# Patient Record
Sex: Male | Born: 1950 | Race: Black or African American | Hispanic: No | Smoking: Never smoker
Health system: Southern US, Community
[De-identification: ages and names within clinical notes are randomized; demographics above are authoritative.]

## PROBLEM LIST (undated history)

## (undated) DIAGNOSIS — E119 Type 2 diabetes mellitus without complications: Secondary | ICD-10-CM

## (undated) DIAGNOSIS — I10 Essential (primary) hypertension: Secondary | ICD-10-CM

## (undated) HISTORY — PX: TONSILLECTOMY: SUR1361

---

## 2005-10-22 ENCOUNTER — Emergency Department: Payer: Self-pay | Admitting: Emergency Medicine

## 2006-08-14 ENCOUNTER — Emergency Department: Payer: Self-pay | Admitting: Emergency Medicine

## 2013-06-06 ENCOUNTER — Emergency Department: Payer: Self-pay | Admitting: Emergency Medicine

## 2013-06-06 LAB — COMPREHENSIVE METABOLIC PANEL
Albumin: 4.2 g/dL (ref 3.4–5.0)
Alkaline Phosphatase: 94 U/L (ref 50–136)
Anion Gap: 3 — ABNORMAL LOW (ref 7–16)
BUN: 17 mg/dL (ref 7–18)
Bilirubin,Total: 0.6 mg/dL (ref 0.2–1.0)
Calcium, Total: 8.8 mg/dL (ref 8.5–10.1)
Chloride: 106 mmol/L (ref 98–107)
Co2: 29 mmol/L (ref 21–32)
EGFR (African American): >60
EGFR (Non-African Amer.): >60
Osmolality: 277 (ref 275–301)
Potassium: 4 mmol/L (ref 3.5–5.1)
SGOT(AST): 43 U/L — ABNORMAL HIGH (ref 15–37)
SGPT (ALT): 44 U/L (ref 12–78)
Total Protein: 7.8 g/dL (ref 6.4–8.2)

## 2013-06-06 LAB — URINALYSIS, COMPLETE
Bacteria: NONE SEEN
Blood: NEGATIVE
Leukocyte Esterase: NEGATIVE
Nitrite: NEGATIVE
Ph: 7 (ref 4.5–8.0)
RBC,UR: 2 /HPF (ref 0–5)
Specific Gravity: 1.026 (ref 1.003–1.030)
WBC UR: <1 /HPF (ref 0–5)

## 2013-06-06 LAB — CBC
HCT: 42.1 % (ref 40.0–52.0)
MCH: 29.1 pg (ref 26.0–34.0)
MCV: 88 fL (ref 80–100)
Platelet: 264 10*3/uL (ref 150–440)
RDW: 12.6 % (ref 11.5–14.5)
WBC: 7 10*3/uL (ref 3.8–10.6)

## 2013-06-06 LAB — LIPASE, BLOOD: Lipase: 85 U/L (ref 73–393)

## 2014-11-01 ENCOUNTER — Emergency Department: Payer: Self-pay | Admitting: Emergency Medicine

## 2015-06-17 IMAGING — CT CT ABD-PELV W/ CM
1 of 2 series · 15 of 32 positions shown, 19 images · non-contrast
Comparison: none

REASON FOR EXAM: (1) rlq + ruq ttp, eval appy vs chole; (2) rlq + ruq
ttp, eval appy vs chole
COMMENTS:   May transport without cardiac monitor

PROCEDURE:     CT  - CT ABDOMEN / PELVIS  W  - June 06, 2013  [DATE]
RESULT:     History: Pain.
Comparison study: No prior.

[Series 2: 3mm soft tissue · axial · 0.75mm/px · z∈[-1010,-596]mm · 15 of 150 slices shown, 19 images]
[im 6/150  soft-tissue]
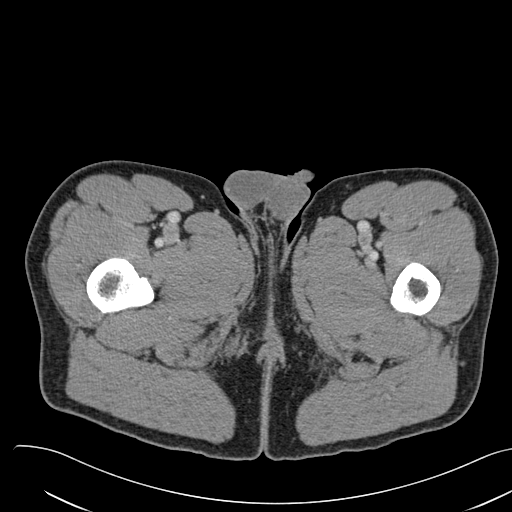
[im 6/150  bone]
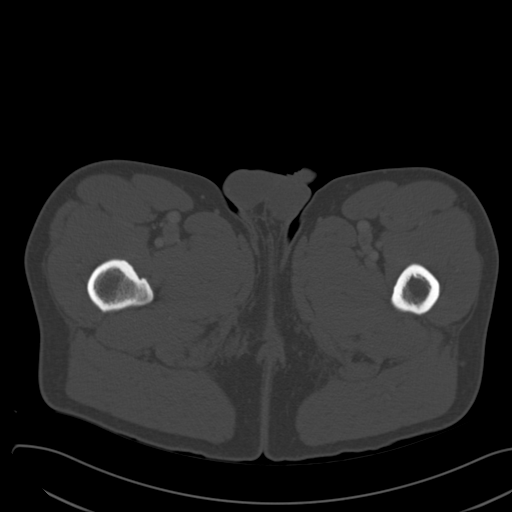
[im 18/150  soft-tissue]
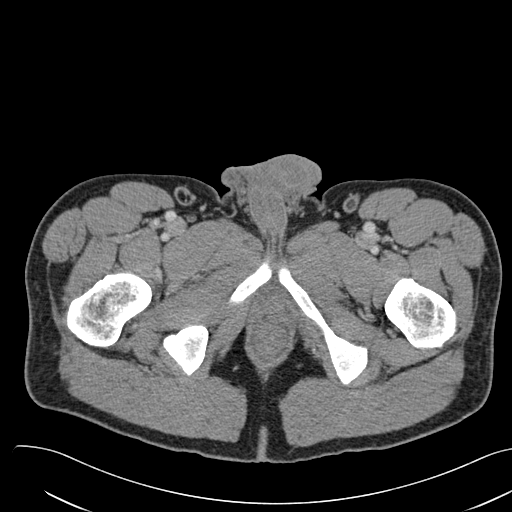
[im 30/150  soft-tissue]
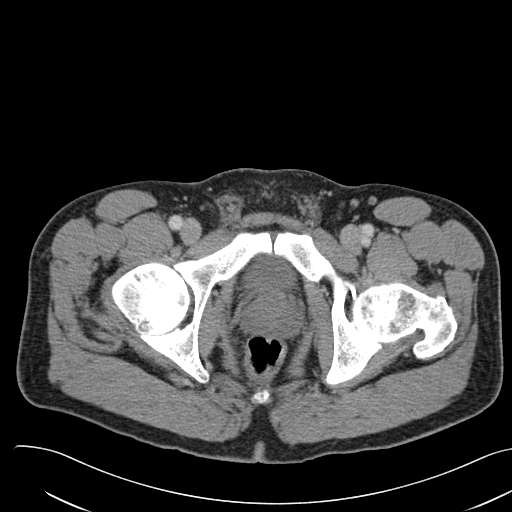
[im 42/150  soft-tissue]
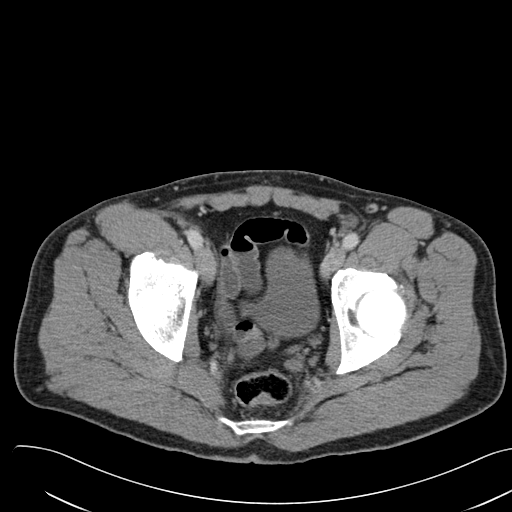
[im 54/150  soft-tissue]
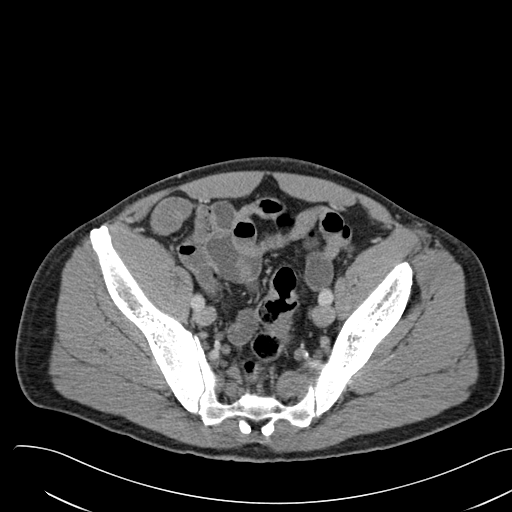
[im 66/150  soft-tissue]
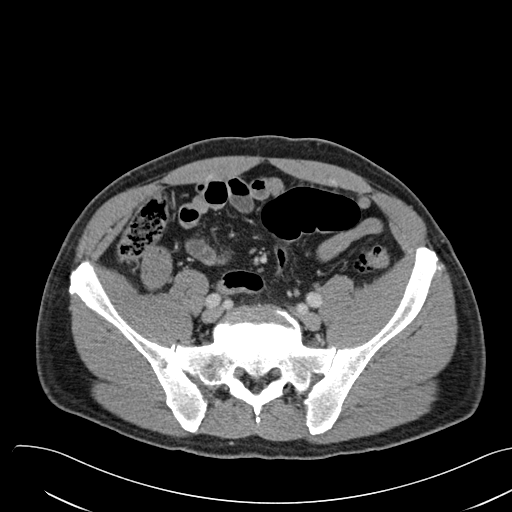
[im 78/150  soft-tissue]
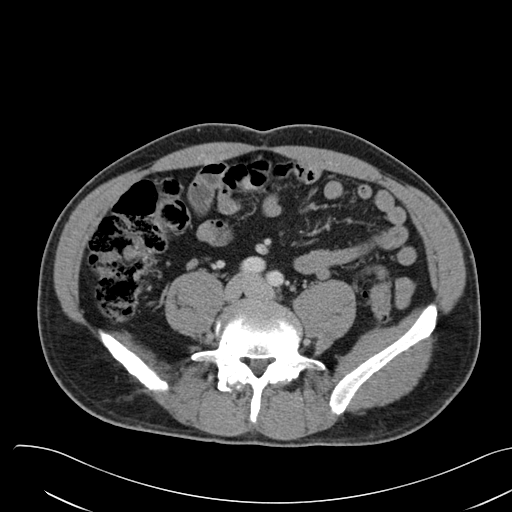
[im 84/150  soft-tissue]
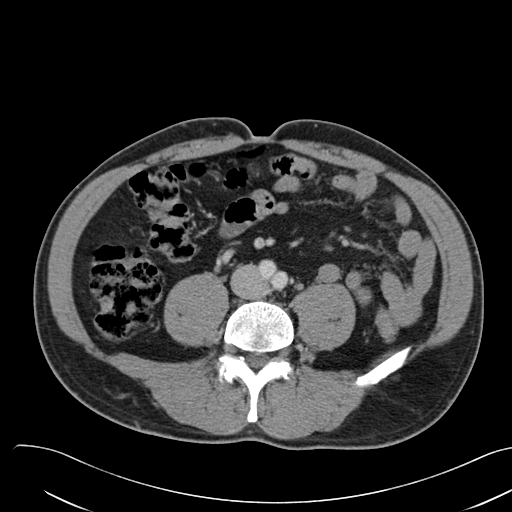
[im 96/150  soft-tissue]
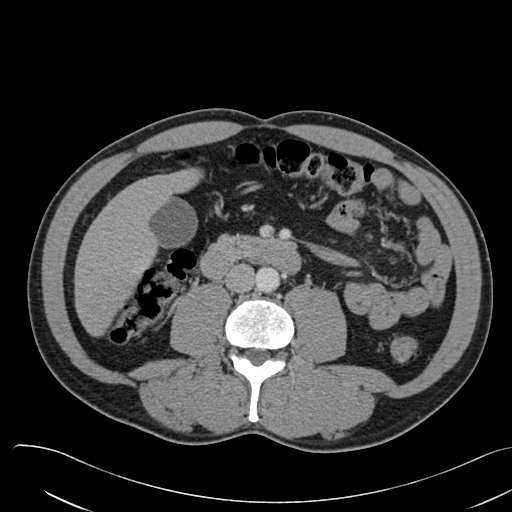
[im 96/150  bone]
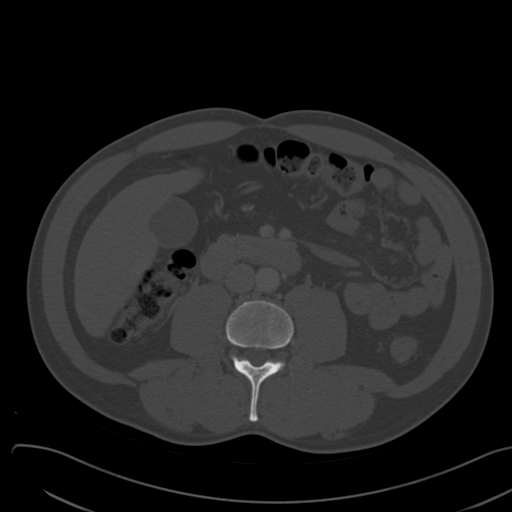
[im 108/150  soft-tissue]
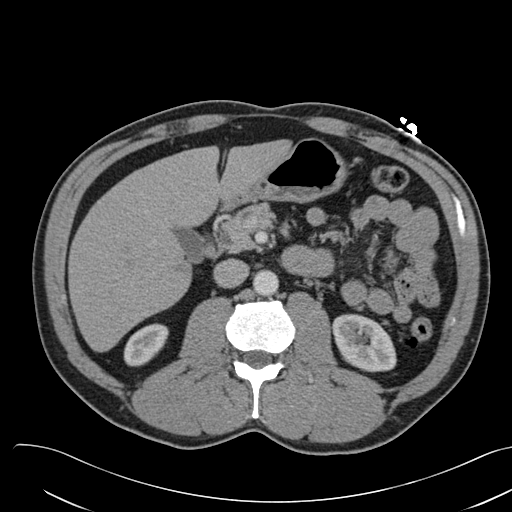
[im 120/150  soft-tissue]
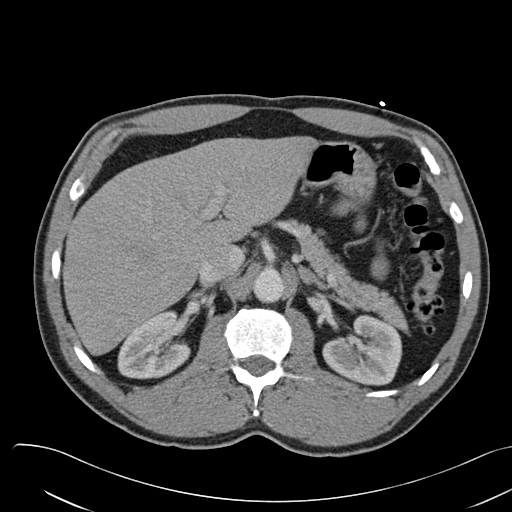
[im 126/150  lung]
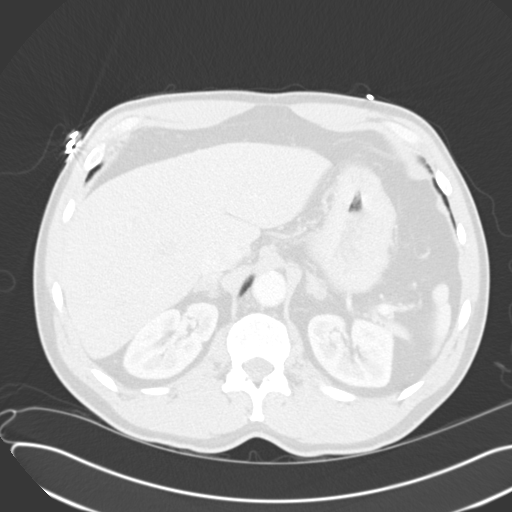
[im 132/150  soft-tissue]
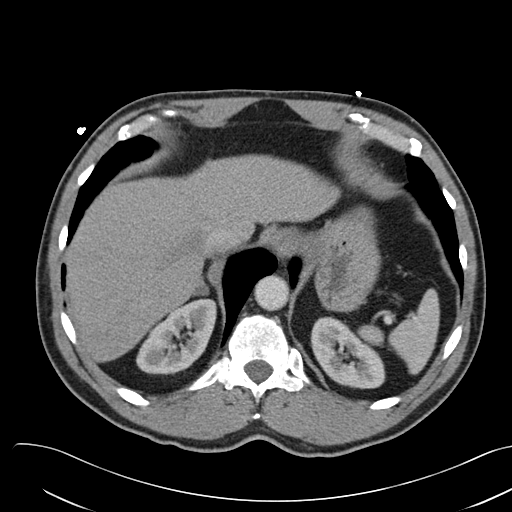
[im 132/150  lung]
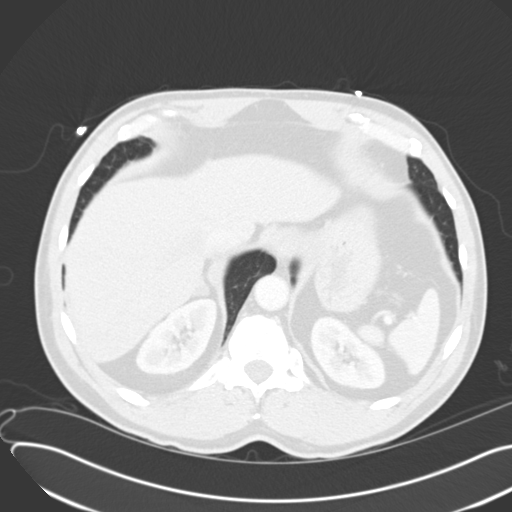
[im 138/150  lung]
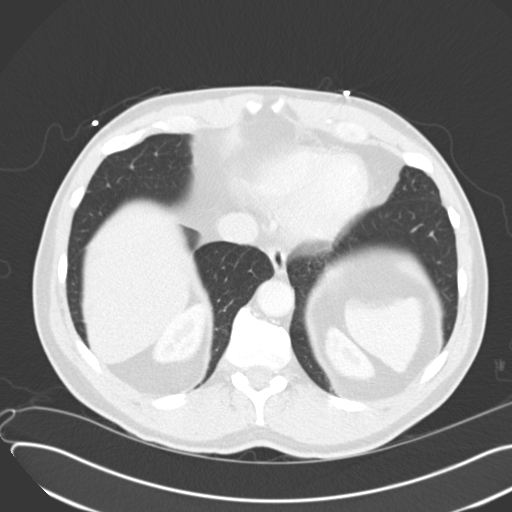
[im 144/150  soft-tissue]
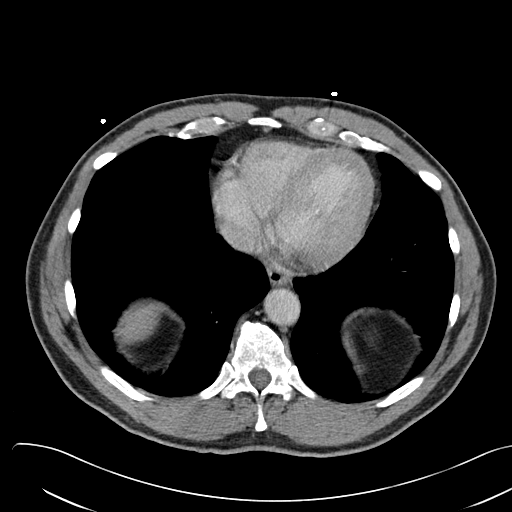
[im 144/150  lung]
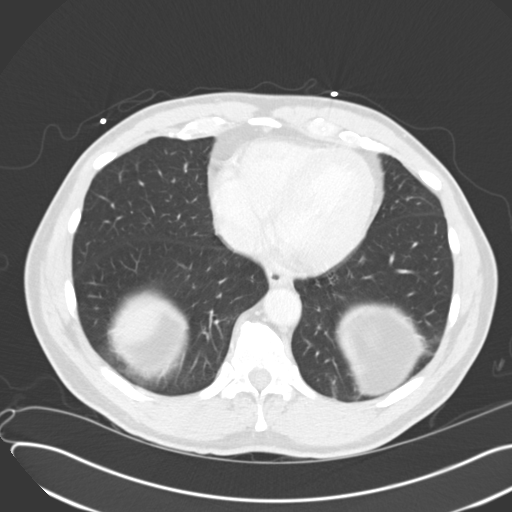

[15 of 32 positions shown; findings below may reference images not displayed]

FINDINGS: CT obtained with contrast utilizing 100 cc of [id number].
Evaluation in 3 dimensions on separate workstation performed. Liver normal.
Spleen normal. Pancreas normal. Gallbladder nondistended. No biliary
distention.

Nodularity noted in both adrenals most likely tiny adrenal adenomas. These
measure approximately 3 mm. No hydronephrosis or focal renal abnormality. No
obstructing ureteral stone. Bladder is nondistended. Mild prostate
enlargement. No free pelvic fluid.

Small inguinal hernias. No herniation of bowel. No significant adenopathy.
Aorta normal caliber. Aortic branch vessels are patent.

Appendix not visualized. Mild wall thickening of the terminal ileum is
noted. Mild terminal ileitis cannot be excluded. Small adjacent mesenteric
lymph nodes are noted. Mesenteric adenitis cannot be excluded. There is no
evidence of bowel obstruction. No free air. Esophagus and stomach are
unremarkable. A lead is unremarkable.

Lung bases are clear. Heart size normal. No acute bony abnormality.
IMPRESSION: Mild wall thickening noted of the terminal ileum. Mild
ileitis from a process such as Crohn's disease or infectious bowel disease
cannot be completely excluded. Adjacent small mesenteric lymph nodes are
noted. Mesenteric adenitis cannot be excluded.

No evidence of bowel obstruction or free air. Appendix not identified.

## 2016-11-11 IMAGING — CR LEFT WRIST - COMPLETE 3+ VIEW
1 series · 4 of 4 positions shown · non-contrast
Comparison: None.

CLINICAL DATA: Left wrist pain for 3 days .  No known injury

EXAM:
LEFT WRIST - COMPLETE 3+ VIEW

[Series 1: pa · 0.17mm/px · 4 of 4 slices shown]
[im 1/4]
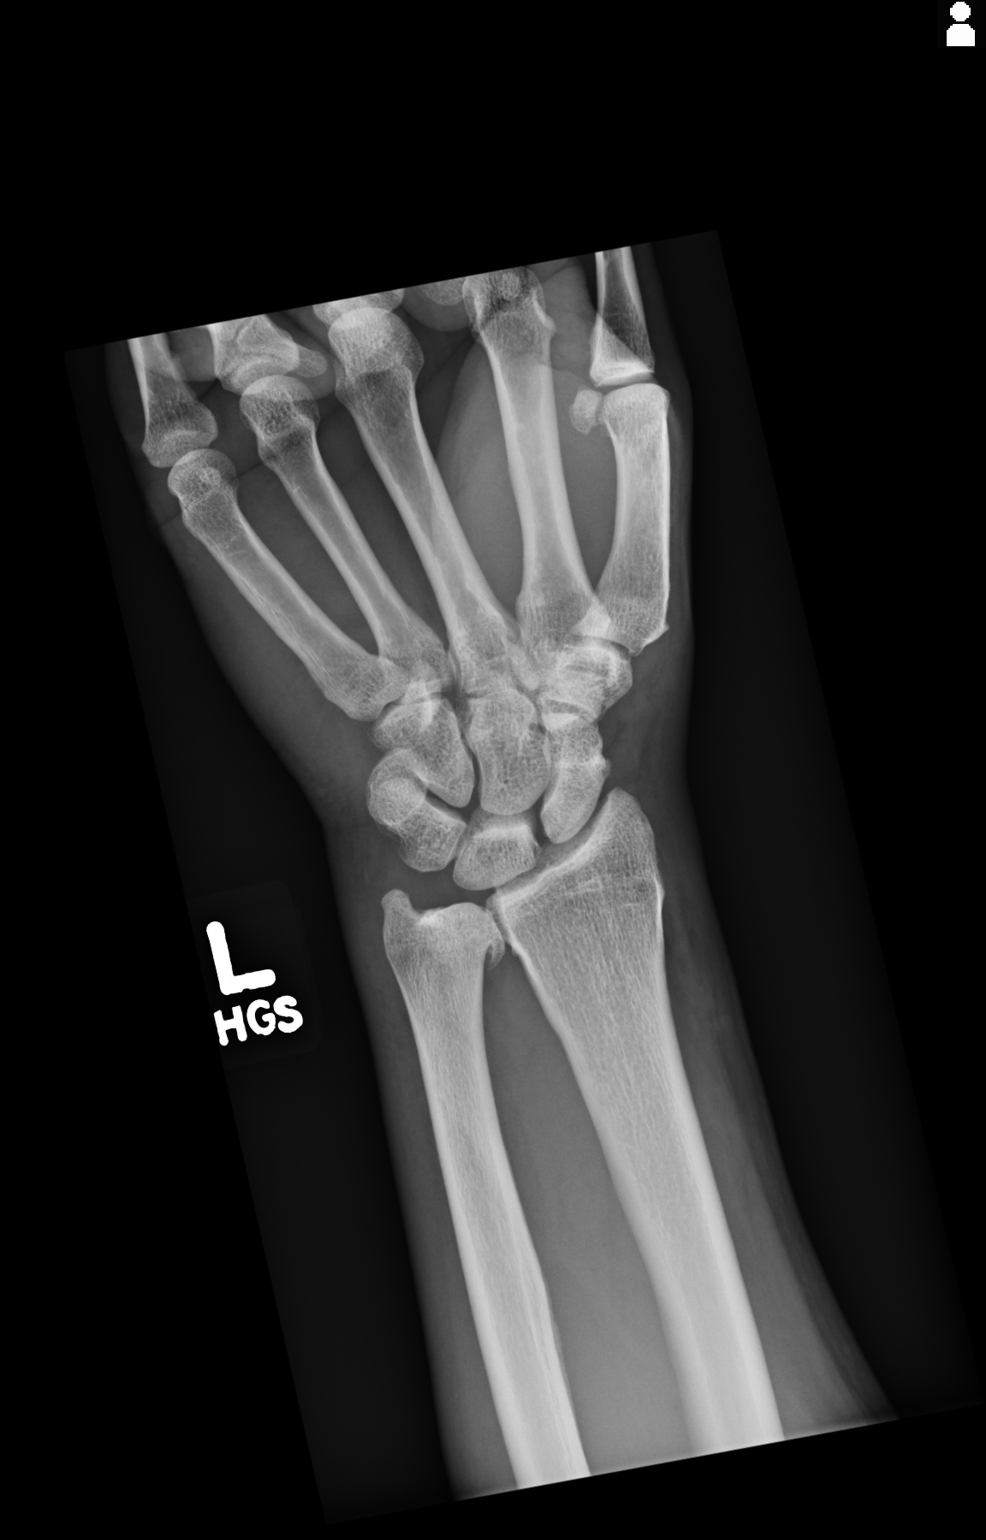
[im 2/4]
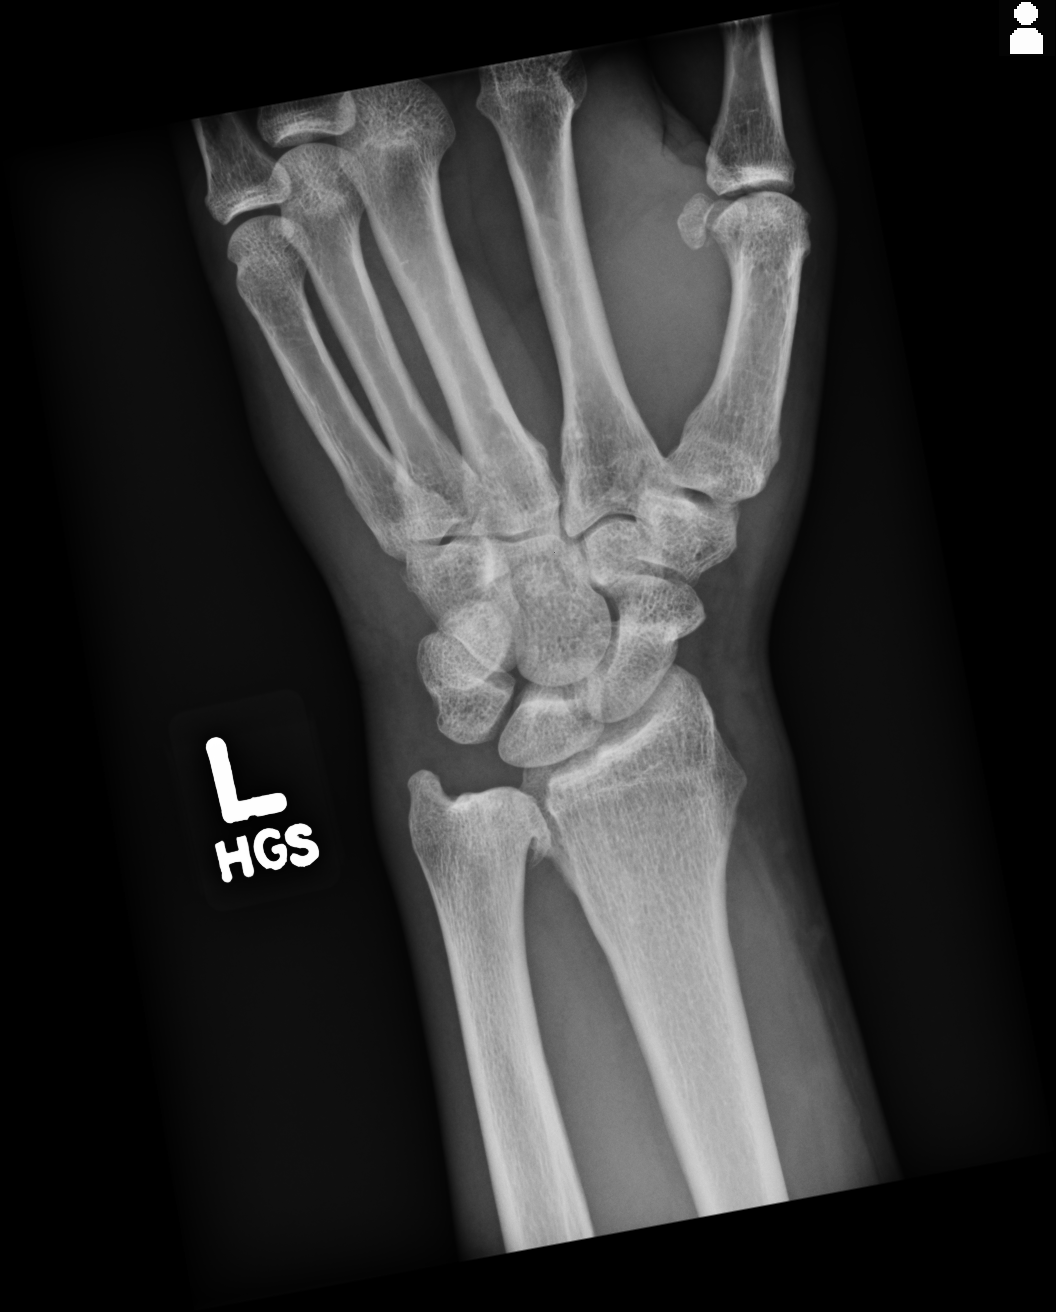
[im 3/4]
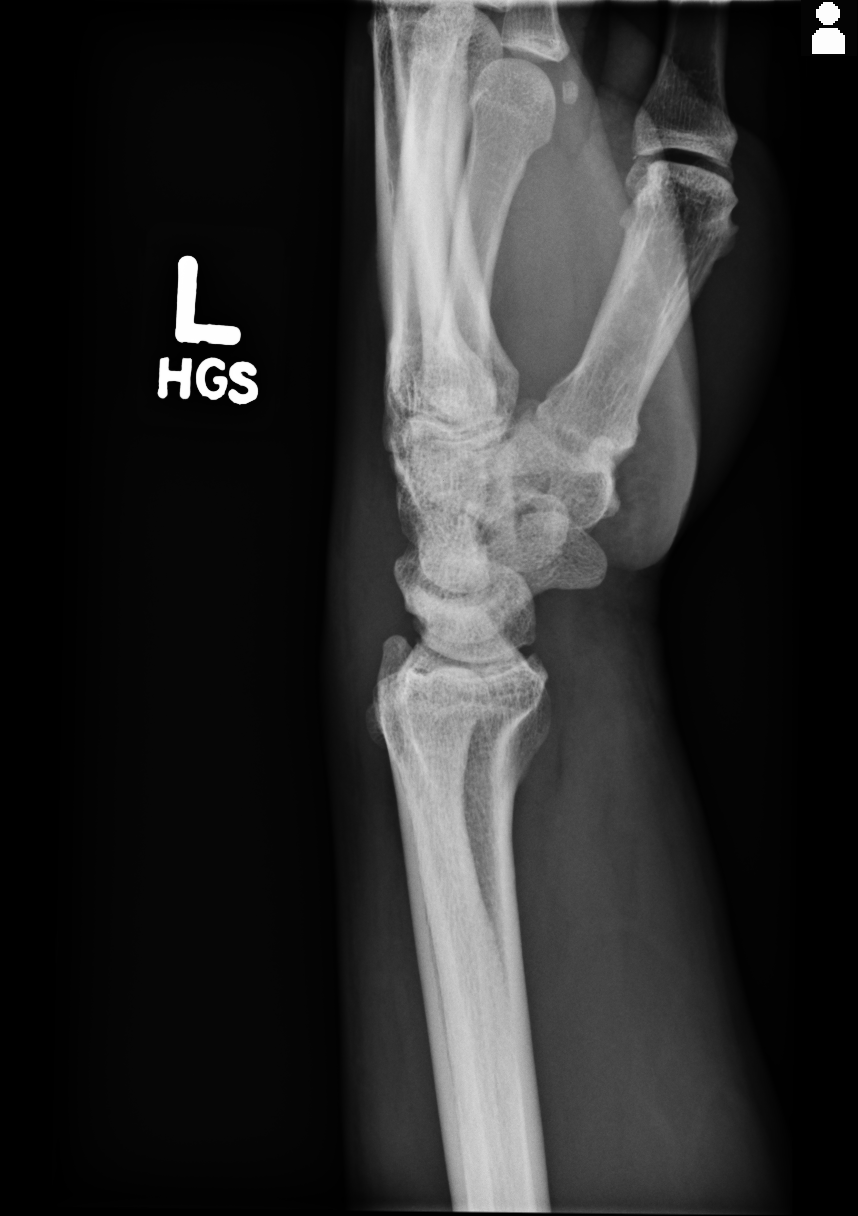
[im 4/4]
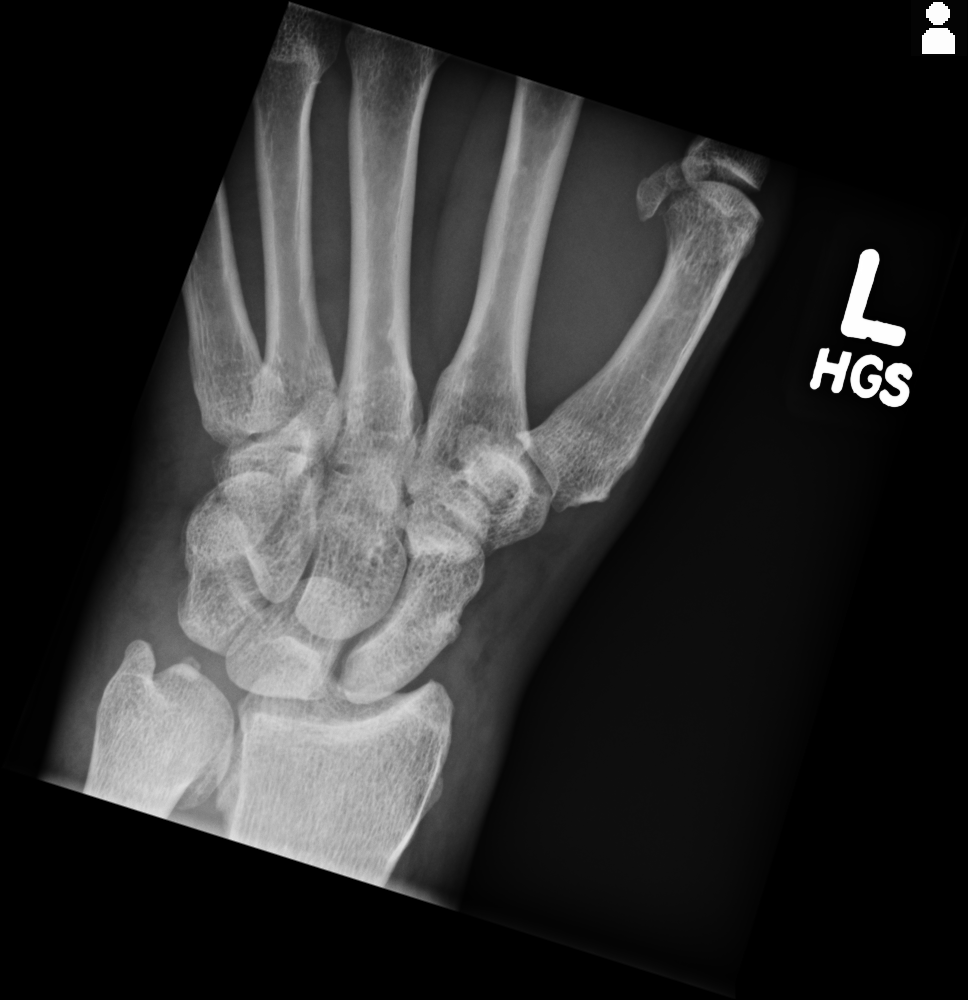

[4 of 4 positions shown; findings below may reference images not displayed]

FINDINGS: Four views of the left wrist submitted. No acute fracture or
subluxation. Mild narrowing of radiocarpal joint space. Mild
spurring of distal ulna.
IMPRESSION: No acute fracture or subluxation.  Mild degenerative changes.

## 2016-12-28 ENCOUNTER — Encounter: Payer: Self-pay | Admitting: Emergency Medicine

## 2016-12-28 ENCOUNTER — Emergency Department
Admission: EM | Admit: 2016-12-28 | Discharge: 2016-12-28 | Disposition: A | Discharge status: Home / Self Care | Payer: PRIVATE HEALTH INSURANCE | Attending: Emergency Medicine | Admitting: Emergency Medicine

## 2016-12-28 DIAGNOSIS — B029 Zoster without complications: Secondary | ICD-10-CM | POA: Insufficient documentation

## 2016-12-28 DIAGNOSIS — I10 Essential (primary) hypertension: Secondary | ICD-10-CM | POA: Insufficient documentation

## 2016-12-28 DIAGNOSIS — R21 Rash and other nonspecific skin eruption: Secondary | ICD-10-CM | POA: Diagnosis present

## 2016-12-28 HISTORY — DX: Essential (primary) hypertension: I10

## 2016-12-28 MED ORDER — VALACYCLOVIR HCL 500 MG PO TABS: 1000.0000 mg | ORAL_TABLET | Freq: Three times a day (TID) | ORAL | 0 refills | Status: AC

## 2016-12-28 NOTE — ED Provider Notes (Signed)
[facility] Emergency Department Provider Note  ____________________________________________  Time seen: Approximately 7:22 PM  I have reviewed the triage vital signs and the nursing notes.   HISTORY  Chief Complaint Rash    HPI Stephen Ballard is a 66 y.o. male that presents to the emergency department with a rash over his left side for 5 days.Patient states that a couple new spots have shown up in the last day or 2. He describes the sensation as a burning sensation. He states the pain is worse at night. He thought it was a insect bite but now he is not sure. He does not remember if he had chickenpox as a child. He denies fever, visual changes, hearing changes, facial pain, ear pain, nose pain, shortness breath, chest pain, nausea, vomiting, abdominal pain.   Past Medical History:  Diagnosis Date  . Hypertension     There are no active problems to display for this patient.   History reviewed. No pertinent surgical history.  Prior to Admission medications   Medication Sig Start Date End Date Taking? Authorizing Provider  valACYclovir (VALTREX) 500 MG tablet Take 2 tablets (1,000 mg total) by mouth 3 (three) times daily. 12/28/16 01/04/17  Enid DerryWagner, Veasna Santibanez, PA-C    Allergies Patient has no known allergies.  No family history on file.  Social History Social History  Substance Use Topics  . Smoking status: Never Smoker  . Smokeless tobacco: Never Used  . Alcohol use No     Review of Systems  Constitutional: No fever/chills Cardiovascular: No chest pain. Respiratory: No SOB. Gastrointestinal: No abdominal pain.  No nausea, no vomiting.  Musculoskeletal: Negative for musculoskeletal pain. Skin: Negative for abrasions, lacerations, ecchymosis. Positive for rash. Neurological: Negative for headaches.   ____________________________________________   PHYSICAL EXAM:  VITAL SIGNS: ED Triage Vitals  Enc Vitals Group     BP 12/28/16 1856 (!)  163/83     Pulse Rate 12/28/16 1856 88     Resp 12/28/16 1856 18     Temp 12/28/16 1856 98.3 F (36.8 C)     Temp Source 12/28/16 1856 Oral     SpO2 12/28/16 1856 96 %     Weight 12/28/16 1857 115 lb (52.2 kg)     Height 12/28/16 1857 5\' 11"  (1.803 m)     Head Circumference --      Peak Flow --      Pain Score 12/28/16 1856 0     Pain Loc --      Pain Edu? --      Excl. in GC? --      Constitutional: Alert and oriented. Well appearing and in no acute distress. Eyes: Conjunctivae are normal. PERRL. EOMI. Head: Atraumatic. ENT:      Ears:      Nose: No congestion/rhinnorhea.      Mouth/Throat: Mucous membranes are moist.  Neck: No stridor. Cardiovascular: Normal rate, regular rhythm.  Good peripheral circulation. Respiratory: Normal respiratory effort without tachypnea or retractions. Lungs CTAB. Good air entry to the bases with no decreased or absent breath sounds. Gastrointestinal: Bowel sounds 4 quadrants. Soft and nontender to palpation. No guarding or rigidity. No palpable masses. No distention. Musculoskeletal: Full range of motion to all extremities. No gross deformities appreciated. Neurologic:  Normal speech and language. No gross focal neurologic deficits are appreciated.  Skin:  Skin is warm, dry and intact. Clumps of vesicles on an erythematous base in the T11 dermatome.    ____________________________________________   LABS (all  labs ordered are listed, but only abnormal results are displayed)  Labs Reviewed - No data to display ____________________________________________  EKG   ____________________________________________  RADIOLOGY  No results found.  ____________________________________________    PROCEDURES  Procedure(s) performed:    Procedures    Medications - No data to display   ____________________________________________   INITIAL IMPRESSION / ASSESSMENT AND PLAN / ED COURSE  Pertinent labs & imaging results that were  available during my care of the patient were reviewed by me and considered in my medical decision making (see chart for details).  Review of the [place] CSRS was performed in accordance of the [facility] prior to dispensing any controlled drugs.   Patient's diagnosis is consistent with shingles. Vital signs and exam are reassuring.  No indication of herpes zoster opthalmicus, Ramsay Hunt syndrome or neurologic complications. Patient has had new lesions within 72 hours so right prescription for antivirals. Up-to-date does not recommend steroids. Patient will be discharged home with prescriptions for valcyclovir. Patient is to follow up with PCP as directed. Patient is given ED precautions to return to the ED for any worsening or new symptoms.     ____________________________________________  FINAL CLINICAL IMPRESSION(S) / ED DIAGNOSES  Final diagnoses:  Herpes zoster without complication      NEW MEDICATIONS STARTED DURING THIS VISIT:  Discharge Medication List as of 12/28/2016  8:06 PM    START taking these medications   Details  valACYclovir (VALTREX) 500 MG tablet Take 2 tablets (1,000 mg total) by mouth 3 (three) times daily., Starting Thu 12/28/2016, Until Thu 01/04/2017, Print            This chart was dictated using voice recognition software/Dragon. Despite best efforts to proofread, errors can occur which can change the meaning. Any change was purely unintentional.    Enid Derry, PA-C 12/29/16 0014    Rockne Menghini, MD 01/03/17 2042

## 2016-12-28 NOTE — ED Triage Notes (Signed)
Patient presents to the ED with rash to his left torso x 4 days.  Patient states, "I thought it was a flea bite but it's getting bigger and not going away."  Patient is in no obvious distress at this time.

## 2016-12-28 NOTE — ED Notes (Signed)

## 2017-05-01 ENCOUNTER — Other Ambulatory Visit: Payer: Self-pay | Admitting: Family Medicine

## 2017-05-03 ENCOUNTER — Other Ambulatory Visit: Payer: Self-pay | Admitting: Family Medicine

## 2017-10-12 ENCOUNTER — Emergency Department
Admission: EM | Admit: 2017-10-12 | Discharge: 2017-10-12 | Disposition: A | Discharge status: Home / Self Care | Payer: Medicare Other | Attending: Emergency Medicine | Admitting: Emergency Medicine

## 2017-10-12 ENCOUNTER — Other Ambulatory Visit: Payer: Self-pay

## 2017-10-12 DIAGNOSIS — Z79899 Other long term (current) drug therapy: Secondary | ICD-10-CM | POA: Insufficient documentation

## 2017-10-12 DIAGNOSIS — I10 Essential (primary) hypertension: Secondary | ICD-10-CM | POA: Insufficient documentation

## 2017-10-12 DIAGNOSIS — R350 Frequency of micturition: Secondary | ICD-10-CM | POA: Diagnosis not present

## 2017-10-12 DIAGNOSIS — E118 Type 2 diabetes mellitus with unspecified complications: Secondary | ICD-10-CM

## 2017-10-12 DIAGNOSIS — R739 Hyperglycemia, unspecified: Secondary | ICD-10-CM

## 2017-10-12 DIAGNOSIS — E1165 Type 2 diabetes mellitus with hyperglycemia: Secondary | ICD-10-CM | POA: Diagnosis not present

## 2017-10-12 DIAGNOSIS — Z7984 Long term (current) use of oral hypoglycemic drugs: Secondary | ICD-10-CM | POA: Insufficient documentation

## 2017-10-12 LAB — URINALYSIS, COMPLETE (UACMP) WITH MICROSCOPIC
BACTERIA UA: NONE SEEN
BILIRUBIN URINE: NEGATIVE
Glucose, UA: >=500 mg/dL — AB
HGB URINE DIPSTICK: NEGATIVE
KETONES UR: NEGATIVE mg/dL
LEUKOCYTES UA: NEGATIVE
NITRITE: NEGATIVE
Protein, ur: NEGATIVE mg/dL
SPECIFIC GRAVITY, URINE: 1.026 (ref 1.005–1.030)
Squamous Epithelial / LPF: NONE SEEN
pH: 6 (ref 5.0–8.0)

## 2017-10-12 LAB — BASIC METABOLIC PANEL
Anion gap: 9 (ref 5–15)
BUN: 21 mg/dL — AB (ref 6–20)
CALCIUM: 8.5 mg/dL — AB (ref 8.9–10.3)
CO2: 25 mmol/L (ref 22–32)
CREATININE: 1.02 mg/dL (ref 0.61–1.24)
Chloride: 98 mmol/L — ABNORMAL LOW (ref 101–111)
GFR calc Af Amer: >60 mL/min (ref >60)
GFR calc non Af Amer: >60 mL/min (ref >60)
Glucose, Bld: 627 mg/dL (ref 65–99)
Potassium: 4.9 mmol/L (ref 3.5–5.1)
SODIUM: 132 mmol/L — AB (ref 135–145)

## 2017-10-12 LAB — GLUCOSE, CAPILLARY
GLUCOSE-CAPILLARY: 488 mg/dL — AB (ref 65–99)
Glucose-Capillary: >600 mg/dL (ref 65–99)
Glucose-Capillary: 355 mg/dL — ABNORMAL HIGH (ref 65–99)
Glucose-Capillary: 286 mg/dL — ABNORMAL HIGH (ref 65–99)

## 2017-10-12 LAB — CBC
HEMATOCRIT: 45.6 % (ref 40.0–52.0)
HEMOGLOBIN: 14.8 g/dL (ref 13.0–18.0)
MCH: 28 pg (ref 26.0–34.0)
MCHC: 32.5 g/dL (ref 32.0–36.0)
MCV: 86.2 fL (ref 80.0–100.0)
Platelets: 215 10*3/uL (ref 150–440)
RBC: 5.28 MIL/uL (ref 4.40–5.90)
RDW: 12.6 % (ref 11.5–14.5)
WBC: 7.9 10*3/uL (ref 3.8–10.6)

## 2017-10-12 MED ORDER — SODIUM CHLORIDE 0.9 % IV BOLUS (SEPSIS)
1000.0000 mL | Freq: Once | INTRAVENOUS | Status: AC
  Administered 2017-10-12: 1000 mL via INTRAVENOUS

## 2017-10-12 MED ORDER — METFORMIN HCL 500 MG PO TABS: 500.0000 mg | ORAL_TABLET | Freq: Two times a day (BID) | ORAL | 11 refills | Status: AC

## 2017-10-12 NOTE — ED Triage Notes (Signed)
Pt states he is having to urinate frequently x 2 weeks. Denies dysuria. Alert, oriented, ambulatory. Denies blood in urine. Denies pain. No distress noted. States borderline diabetic. A week ago family member checked CBG and it read "high". Denies N&V.

## 2017-10-12 NOTE — ED Provider Notes (Signed)
-----------------------------------------   6:44 PM on 10/12/2017 -----------------------------------------  Patient's blood glucose now below 300 at 286.  We will discharge with metformin and PCP follow-up.  I discussed this plan of care with the patient who is agreeable.   Minna AntisPaduchowski, Jalea Bronaugh, MD 10/12/17 [phone]

## 2017-10-12 NOTE — Discharge Instructions (Signed)
Please seek medical attention for any high fevers, chest pain, shortness of breath, change in behavior, persistent vomiting, bloody stool or any other new or concerning symptoms.  

## 2017-10-20 NOTE — ED Provider Notes (Signed)
[facility] Emergency Department Provider Note   ____________________________________________   I have reviewed the triage vital signs and the nursing notes.   HISTORY  Chief Complaint Urinary Frequency   History limited by: Not Limited   HPI Stephen Ballard is a 67 y.o. male who presents to the emergency department today with primary complaint of urinary frequency. This has been going on for the past two weeks. He denies any pain with urination or bad odor to his urine. He denies similar symptoms in the past. He did have a family member check his sugar roughly one week ago and it read as high. He denies any history of diabetes. Denies any recent chest pain, shortness of breath or fevers.   Per medical record review patient has a history of HTN  Past Medical History:  Diagnosis Date  . Hypertension     There are no active problems to display for this patient.   Past Surgical History:  Procedure Laterality Date  . TONSILLECTOMY      Prior to Admission medications   Medication Sig Start Date End Date Taking? Authorizing Provider  lisinopril-hydrochlorothiazide (PRINZIDE,ZESTORETIC) 20-12.5 MG tablet TAKE 1 TABLET BY MOUTH ONCE DAILY 05/03/17  Yes Chrismon, Jodell Ciproennis E, PA  metFORMIN (GLUCOPHAGE) 500 MG tablet Take 1 tablet (500 mg total) by mouth 2 (two) times daily with a meal. 10/12/17 10/12/18  Stephen SemenGoodman, Cebert Dettmann, MD    Allergies Patient has no known allergies.  History reviewed. No pertinent family history.  Social History Social History   Tobacco Use  . Smoking status: Never Smoker  . Smokeless tobacco: Never Used  Substance Use Topics  . Alcohol use: No  . Drug use: No    Review of Systems Constitutional: No fever/chills Eyes: No visual changes. ENT: No sore throat. Cardiovascular: Denies chest pain. Respiratory: Denies shortness of breath. Gastrointestinal: No abdominal pain.  No nausea, no vomiting.  No diarrhea.   Genitourinary:  Positive for increase in urinary frequency. Denies any dysuria or bad odor to his urine. Musculoskeletal: Negative for back pain. Skin: Negative for rash. Neurological: Negative for headaches, focal weakness or numbness.  ____________________________________________   PHYSICAL EXAM:  VITAL SIGNS: ED Triage Vitals [10/12/17 1143]  Enc Vitals Group     BP (!) 172/87     Pulse Rate (!) 110     Resp 18     Temp 98.4 F (36.9 C)     Temp Source Oral     SpO2 100 %     Weight 210 lb (95.3 kg)     Height 5\' 11"  (1.803 m)     Head Circumference      Peak Flow      Pain Score 0   Constitutional: Alert and oriented. Well appearing and in no distress. Eyes: Conjunctivae are normal.  ENT   Head: Normocephalic and atraumatic.   Nose: No congestion/rhinnorhea.   Mouth/Throat: Mucous membranes are moist.   Neck: No stridor. Hematological/Lymphatic/Immunilogical: No cervical lymphadenopathy. Cardiovascular: Normal rate, regular rhythm.  No murmurs, rubs, or gallops.  Respiratory: Normal respiratory effort without tachypnea nor retractions. Breath sounds are clear and equal bilaterally. No wheezes/rales/rhonchi. Gastrointestinal: Soft and non tender. No rebound. No guarding.  Genitourinary: Deferred Musculoskeletal: Normal range of motion in all extremities. No lower extremity edema. Neurologic:  Normal speech and language. No gross focal neurologic deficits are appreciated.  Skin:  Skin is warm, dry and intact. No rash noted. Psychiatric: Mood and affect are normal. Speech and behavior are  normal. Patient exhibits appropriate insight and judgment.  ____________________________________________    LABS (pertinent positives/negatives)  BMP na 132, k 4.9, glu 627, cr 1.02, anion gap 9 CBC wbc 7.9, hgb 14.8, plt 215 UA negative ketones ____________________________________________   EKG  None  ____________________________________________     RADIOLOGY  None  ____________________________________________   PROCEDURES  Procedures  ____________________________________________   INITIAL IMPRESSION / ASSESSMENT AND PLAN / ED COURSE  Pertinent labs & imaging results that were available during my care of the patient were reviewed by me and considered in my medical decision making (see chart for details).  Patient presents with urinary frequency. Had reading of high blood sugar one week ago. Patients blood sugar high here. No signs or symptoms of DKA. Think at this point new diagnosis of DM. Will give IV fluids and recheck sugar. If improving think patient can be safely discharged with metformin prescription. Discussed findings and plan with patient.  ____________________________________________   FINAL CLINICAL IMPRESSION(S) / ED DIAGNOSES  Final diagnoses:  Urinary frequency  Hyperglycemia  Type 2 diabetes mellitus with complication, without long-term current use of insulin (HCC)     Note: This dictation was prepared with Dragon dictation. Any transcriptional errors that result from this process are unintentional     Stephen Semen, MD 10/20/17 [phone]

## 2017-10-30 ENCOUNTER — Other Ambulatory Visit: Payer: Self-pay | Admitting: Family Medicine

## 2017-10-30 NOTE — Telephone Encounter (Signed)
Was a [facility] patient. Last OV I see is 12/25/2013 [facility] unsure if you [facility] patient after that [facility]. Last RF was 05/03/17

## 2017-10-30 NOTE — Telephone Encounter (Signed)
Sent a message with the refill (for 30 days) to get patient to come in for recheck of BP and establish care here.

## 2018-04-18 ENCOUNTER — Encounter: Payer: Self-pay | Admitting: Physician Assistant

## 2018-05-15 ENCOUNTER — Encounter: Payer: Self-pay | Admitting: *Deleted

## 2023-01-12 ENCOUNTER — Other Ambulatory Visit: Payer: Self-pay | Admitting: Primary Care

## 2023-01-12 DIAGNOSIS — R059 Cough, unspecified: Secondary | ICD-10-CM

## 2023-01-16 ENCOUNTER — Emergency Department
Admission: EM | Admit: 2023-01-16 | Discharge: 2023-01-16 | Disposition: A | Discharge status: Home / Self Care | Payer: Medicare HMO | Attending: Emergency Medicine | Admitting: Emergency Medicine

## 2023-01-16 ENCOUNTER — Encounter: Payer: Self-pay | Admitting: Emergency Medicine

## 2023-01-16 ENCOUNTER — Other Ambulatory Visit: Payer: Self-pay

## 2023-01-16 ENCOUNTER — Emergency Department: Payer: Medicare HMO

## 2023-01-16 DIAGNOSIS — J40 Bronchitis, not specified as acute or chronic: Secondary | ICD-10-CM | POA: Diagnosis not present

## 2023-01-16 DIAGNOSIS — I10 Essential (primary) hypertension: Secondary | ICD-10-CM | POA: Diagnosis not present

## 2023-01-16 DIAGNOSIS — E119 Type 2 diabetes mellitus without complications: Secondary | ICD-10-CM | POA: Insufficient documentation

## 2023-01-16 DIAGNOSIS — R059 Cough, unspecified: Secondary | ICD-10-CM

## 2023-01-16 HISTORY — DX: Type 2 diabetes mellitus without complications: E11.9

## 2023-01-16 MED ORDER — AZITHROMYCIN 250 MG PO TABS: ORAL_TABLET | ORAL | 0 refills | Status: AC

## 2023-01-16 MED ORDER — GUAIFENESIN-CODEINE 100-10 MG/5ML PO SOLN: 5.0000 mL | Freq: Four times a day (QID) | ORAL | 0 refills | Status: AC | PRN

## 2023-01-16 NOTE — ED Triage Notes (Signed)
C/o mid back pain x 1 month. States started while having allergies with coughing and sneezing. Denies cough/sneezing now.  AAOx3.  Skin warm and dry. NAD

## 2023-01-16 NOTE — Discharge Instructions (Signed)
Please take your antibiotics as prescribed, please take your cough medication if needed as written.  Do not drink alcohol or drive while taking this medicine.  Please follow-up with your doctor in the next several days for recheck/reevaluation.  Otherwise return to the emergency department for any worsening discomfort worsening cough fever or any other symptom personally concerning to yourself.

## 2023-01-16 NOTE — ED Notes (Signed)
See triage note  Presents with mid back pain  States he developed pain about 1 month ago after a coughing spell  States pain comes and goes

## 2023-01-16 NOTE — ED Provider Notes (Addendum)
[facility] Provider Note    Event Date/Time   First MD Initiated Contact with Patient 01/16/23 1029     (approximate)  History   Chief Complaint: Back Pain  HPI  Stephen Ballard is a 72 y.o. male with a past medical history of diabetes, hypertension, presents to the emergency department for continued cough with some mild left lower posterior chest pain.  According to the patient for the past month or so he has had some congestion and a cough.  Patient states last year he went through a similar episode and saw his doctor was diagnosed with allergies.  He states this year more the same he has been having a mild cough for the past month or so but now for the last few days he has been experiencing some pain in the posterior left lower chest only when coughing denies any pain at rest.  Patient states his wife was diagnosed with lung cancer last year he was concerned so he came to the emergency department for evaluation.  No smoking history per patient.  Physical Exam   Triage Vital Signs: ED Triage Vitals  Enc Vitals Group     BP 01/16/23 0939 (!) 145/68     Pulse Rate 01/16/23 0939 66     Resp 01/16/23 0939 20     Temp 01/16/23 0939 98.1 F (36.7 C)     Temp Source 01/16/23 0939 Oral     SpO2 01/16/23 0939 95 %     Weight 01/16/23 0929 210 lb 1.6 oz (95.3 kg)     Height 01/16/23 0929 5\' 11"  (1.803 m)     Head Circumference --      Peak Flow --      Pain Score 01/16/23 0929 1     Pain Loc --      Pain Edu? --      Excl. in GC? --     Most recent vital signs: Vitals:   01/16/23 0939  BP: (!) 145/68  Pulse: 66  Resp: 20  Temp: 98.1 F (36.7 C)  SpO2: 95%    General: Awake, no distress.  CV:  Good peripheral perfusion.  Regular rate and rhythm  Resp:  Normal effort.  Equal breath sounds bilaterally.  Chest wall anterior and posterior is nontender to palpation. Abd:  No distention.  Soft, nontender.  CVA tenderness.  ED Results / Procedures /  Treatments   RADIOLOGY  I have reviewed and interpreted the chest x-ray images.  No consolidation seen on my evaluation. Chest x-ray read as negative by radiology.  MEDICATIONS ORDERED IN ED: Medications - No data to display   IMPRESSION / MDM / ASSESSMENT AND PLAN / ED COURSE  I reviewed the triage vital signs and the nursing notes.  Patient's presentation is most consistent with acute presentation with potential threat to life or bodily function.  Patient presents emergency department for continued cough x 1 month.  States at times he will have some discomfort in the left lower chest posteriorly when he coughs.  Denies any pleuritic pain.  No nausea shortness of breath or diaphoresis no anterior chest discomfort at any point.  Patient was concerned that he could be developing an infection or lung cancer so he came to the emergency department for evaluation.  No smoking history.  Will obtain a two-view chest x-ray to further evaluate.  Vital signs are reassuring.  No concern for ACS clinically.  Chest x-ray does not appear to show  any obvious consolidation.  Awaiting radiology confirmation.  Will discharge the patient with a short course of antibiotic and cough medication given the patient's 1 month of cough symptoms.  Patient will follow-up with his doctor.  Patient agreeable to plan.  FINAL CLINICAL IMPRESSION(S) / ED DIAGNOSES   Cough Bronchitis  Rx / DC Orders   Zithromax Codeine/guaifenesin  Note:  This document was prepared using Dragon voice recognition software and may include unintentional dictation errors.   Minna Antis, MD 01/16/23 1301    Minna Antis, MD 01/16/23 1302
